# Patient Record
Sex: Female | Born: 1970 | Race: White | Hispanic: No | Marital: Single | Smoking: Former smoker
Health system: Southern US, Community
[De-identification: ages and names within clinical notes are randomized; demographics above are authoritative.]

## PROBLEM LIST (undated history)

## (undated) DIAGNOSIS — D649 Anemia, unspecified: Secondary | ICD-10-CM

---

## 2017-10-22 ENCOUNTER — Encounter (HOSPITAL_COMMUNITY): Payer: Self-pay | Admitting: *Deleted

## 2017-10-22 ENCOUNTER — Inpatient Hospital Stay (HOSPITAL_COMMUNITY): Payer: Self-pay

## 2017-10-22 ENCOUNTER — Inpatient Hospital Stay (HOSPITAL_COMMUNITY)
Admission: AD | Admit: 2017-10-22 | Discharge: 2017-10-24 | DRG: 175 | Disposition: A | Discharge status: Home / Self Care | Payer: Self-pay | Source: Other Acute Inpatient Hospital | Attending: Internal Medicine | Admitting: Internal Medicine

## 2017-10-22 DIAGNOSIS — I2699 Other pulmonary embolism without acute cor pulmonale: Principal | ICD-10-CM | POA: Diagnosis present

## 2017-10-22 DIAGNOSIS — E1165 Type 2 diabetes mellitus with hyperglycemia: Secondary | ICD-10-CM | POA: Diagnosis present

## 2017-10-22 DIAGNOSIS — D509 Iron deficiency anemia, unspecified: Secondary | ICD-10-CM | POA: Diagnosis present

## 2017-10-22 DIAGNOSIS — K838 Other specified diseases of biliary tract: Secondary | ICD-10-CM

## 2017-10-22 DIAGNOSIS — D649 Anemia, unspecified: Secondary | ICD-10-CM | POA: Diagnosis present

## 2017-10-22 DIAGNOSIS — R945 Abnormal results of liver function studies: Secondary | ICD-10-CM

## 2017-10-22 DIAGNOSIS — R8271 Bacteriuria: Secondary | ICD-10-CM | POA: Diagnosis present

## 2017-10-22 DIAGNOSIS — R7989 Other specified abnormal findings of blood chemistry: Secondary | ICD-10-CM

## 2017-10-22 DIAGNOSIS — N921 Excessive and frequent menstruation with irregular cycle: Secondary | ICD-10-CM | POA: Diagnosis present

## 2017-10-22 DIAGNOSIS — Z87891 Personal history of nicotine dependence: Secondary | ICD-10-CM

## 2017-10-22 DIAGNOSIS — J9601 Acute respiratory failure with hypoxia: Secondary | ICD-10-CM | POA: Diagnosis present

## 2017-10-22 DIAGNOSIS — R55 Syncope and collapse: Secondary | ICD-10-CM

## 2017-10-22 DIAGNOSIS — I248 Other forms of acute ischemic heart disease: Secondary | ICD-10-CM | POA: Diagnosis present

## 2017-10-22 DIAGNOSIS — R739 Hyperglycemia, unspecified: Secondary | ICD-10-CM | POA: Diagnosis present

## 2017-10-22 HISTORY — DX: Anemia, unspecified: D64.9

## 2017-10-22 LAB — CBC WITH DIFFERENTIAL/PLATELET
Abs Immature Granulocytes: 0.02 10*3/uL (ref 0.00–0.07)
Basophils Absolute: 0 10*3/uL (ref 0.0–0.1)
Basophils Relative: 0 %
Eosinophils Absolute: 0 10*3/uL (ref 0.0–0.5)
Eosinophils Relative: 0 %
HCT: 29.1 % — ABNORMAL LOW (ref 36.0–46.0)
Hemoglobin: 7.6 g/dL — ABNORMAL LOW (ref 12.0–15.0)
Immature Granulocytes: 0 %
Lymphocytes Relative: 10 %
Lymphs Abs: 1.1 10*3/uL (ref 0.7–4.0)
MCH: 17.6 pg — ABNORMAL LOW (ref 26.0–34.0)
MCHC: 26.1 g/dL — ABNORMAL LOW (ref 30.0–36.0)
MCV: 67.2 fL — ABNORMAL LOW (ref 80.0–100.0)
Monocytes Absolute: 0.4 10*3/uL (ref 0.1–1.0)
Monocytes Relative: 4 %
Neutro Abs: 8.8 10*3/uL — ABNORMAL HIGH (ref 1.7–7.7)
Neutrophils Relative %: 86 %
Platelets: 281 10*3/uL (ref 150–400)
RBC: 4.33 MIL/uL (ref 3.87–5.11)
RDW: 19.3 % — ABNORMAL HIGH (ref 11.5–15.5)
WBC: 10.3 10*3/uL (ref 4.0–10.5)
nRBC: 0 % (ref 0.0–0.2)

## 2017-10-22 LAB — IRON AND TIBC
IRON: 11 ug/dL — AB (ref 28–170)
SATURATION RATIOS: 2 % — AB (ref 10.4–31.8)
TIBC: 546 ug/dL — ABNORMAL HIGH (ref 250–450)
UIBC: 535 ug/dL

## 2017-10-22 LAB — FERRITIN: Ferritin: 3 ng/mL — ABNORMAL LOW (ref 11–307)

## 2017-10-22 LAB — HCG, QUANTITATIVE, PREGNANCY: hCG, Beta Chain, Quant, S: <1 m[IU]/mL (ref <5)

## 2017-10-22 LAB — VITAMIN B12: Vitamin B-12: 244 pg/mL (ref 180–914)

## 2017-10-22 LAB — RETICULOCYTES
Immature Retic Fract: 25.3 % — ABNORMAL HIGH (ref 2.3–15.9)
RBC.: 4.33 MIL/uL (ref 3.87–5.11)
Retic Count, Absolute: 43.3 10*3/uL (ref 19.0–186.0)
Retic Ct Pct: 1 % (ref 0.4–3.1)

## 2017-10-22 LAB — FOLATE: FOLATE: 17.3 ng/mL (ref >5.9)

## 2017-10-22 LAB — TROPONIN I: TROPONIN I: 0.85 ng/mL — AB (ref <0.03)

## 2017-10-22 MED ORDER — ACETAMINOPHEN 325 MG PO TABS: 650.0000 mg | ORAL_TABLET | Freq: Four times a day (QID) | ORAL | Status: DC | PRN

## 2017-10-22 MED ORDER — ACETAMINOPHEN 650 MG RE SUPP: 650.0000 mg | Freq: Four times a day (QID) | RECTAL | Status: DC | PRN

## 2017-10-22 MED ORDER — HYDROCODONE-ACETAMINOPHEN 5-325 MG PO TABS
1.0000 | ORAL_TABLET | Freq: Four times a day (QID) | ORAL | Status: DC | PRN
  Administered 2017-10-23: 1 via ORAL
  Filled 2017-10-22: qty 1

## 2017-10-22 MED ORDER — ONDANSETRON HCL 4 MG PO TABS: 4.0000 mg | ORAL_TABLET | Freq: Four times a day (QID) | ORAL | Status: DC | PRN

## 2017-10-22 MED ORDER — HEPARIN (PORCINE) IN NACL 100-0.45 UNIT/ML-% IJ SOLN
1200.0000 [IU]/h | INTRAMUSCULAR | Status: DC
  Administered 2017-10-23: 1300 [IU]/h via INTRAVENOUS
  Administered 2017-10-23: 1200 [IU]/h via INTRAVENOUS
  Filled 2017-10-22 (×2): qty 250

## 2017-10-22 MED ORDER — INSULIN ASPART 100 UNIT/ML ~~LOC~~ SOLN
0.0000 [IU] | SUBCUTANEOUS | Status: DC
  Administered 2017-10-23: 1 [IU] via SUBCUTANEOUS

## 2017-10-22 MED ORDER — SODIUM CHLORIDE 0.9 % IV SOLN
INTRAVENOUS | Status: DC
  Administered 2017-10-22: 22:00:00 via INTRAVENOUS

## 2017-10-22 MED ORDER — ONDANSETRON HCL 4 MG/2ML IJ SOLN: 4.0000 mg | Freq: Four times a day (QID) | INTRAMUSCULAR | Status: DC | PRN

## 2017-10-22 NOTE — H&P (Signed)
[address] Joyce Williams ZOX:[id number] DOB: 27-Apr-1970 DOA: 10/22/2017     PCP: Patient, No Pcp Per  None Outpatient Specialists:  NONE    Patient arrived to ER on  at   Patient coming from: home Lives   With family    Chief Complaint:  Shortness of breath HPI: Joyce Williams is a 47 y.o. female with medical history significant of Tobacco abuse    Presented with  Sudden onset of Shortness of breath after taking a anp while sitting in an chair when she got up she was very short of breath   but felt palpitations. Boyfriend though she may have passed out for a few seconds. She presented to [facility] ER was found to be severely hypoxic at first required nonrebreather and down to 5 L She had CTA done and was found to have large bilateral PE.  Never hypotensive but has been tachycardic up to 116.  Patient does not take any medications.  Denies any history of bleeding.  No blood per rectum.  Hemoglobin noted to be 8.1  Case was discussed with PCCM critical care Dr. Isaiah Serge who advised transfer to [facility] to stepdown and admit to hospitalist service. NO prior hx of blood clots, never had a mammogram No hx of blood clots She have had leg swelling and off for few years now but lately has been swollen more both legs.  Denies any travel she is fairly active recent injury.  No use of hormonal medications she is only using condoms on and off.  Last menstrual bleeding states couple weeks ago. Mother has hx of ovarian cancer and passed from brain tumor.  No history of miscarriages     The following Work up has been ordered so far:  Orders Placed This Encounter  Procedures  . Heparin level (unfractionated)  . Heparin level (unfractionated)  . CBC  . heparin per pharmacy consult     Medications  heparin ADULT infusion 100 units/mL (25000 units/219mL sodium chloride 0.45%) (1,100 Units/hr Intravenous Handoff 10/22/17 1936)    Significant initial  Findings:  ABG  7.340/36/56  On 6L  Na 135 K 3.8  Cr 0.6 Lactic Acid 6.4 Glucose 248  LFT's AST 48 Total bili WNL  Trop 0.03 BNP 31 Alb 3.9 WBC  6.3  HG 8.1      UA  ordered     CT A -spoke bilaterally pulmonary embolism segmental and subsegmental right hilar lymph node 50 mm airspace consolidation of the right lower lobe evidence of right heart strain RV to LV ratio of 2 Normal dilation of extrahepatic common bile duct measures up to 11 mm cross-section Ultrasound and MRCP to rule out obstructive lesion  ECG:  Personally reviewed by me showing: HR :108 Rhythm:  Sinus tachycardia  no evidence of ischemic changes QTC 421       ED Triage Vitals [10/22/17 1806]  Enc Vitals Group     BP 118/76     Pulse Rate (!) 110     Resp      Temp 98.6 F (37 C)     Temp Source Oral     SpO2 100 %     Weight 166 lb 14.4 oz (75.7 kg)     Height 5\' 3"  (1.6 m)     Head Circumference      Peak Flow      Pain Score 4     Pain Loc      Pain Edu?  Excl. in GC?   WUJW(11)@       Latest  Blood pressure 118/76, pulse (!) 110, temperature 98.6 F (37 C), temperature source Oral, height 5\' 3"  (1.6 m), weight 75.7 kg, SpO2 100 %.      ER Provider Called:  pCCM    Dr.Mannan They Recommend admit to medicine    Hospitalist was called for admission for pulmonary embolism with possible infarction   Review of Systems:    Pertinent positives include: fatigue, shortness of breath at rest.  dyspnea on exertion, Bilateral lower extremity swelling  Constitutional:  No weight loss, night sweats, Fevers, chills,weight loss  HEENT:  No headaches, Difficulty swallowing,Tooth/dental problems,Sore throat,  No sneezing, itching, ear ache, nasal congestion, post nasal drip,  Cardio-vascular:  No chest pain, Orthopnea, PND, anasarca, dizziness, palpitations.no   GI:  No heartburn, indigestion, abdominal pain, nausea, vomiting, diarrhea, change in bowel habits, loss of appetite, melena, blood in  stool, hematemesis Resp:  no  No excess mucus, no productive cough, No non-productive cough, No coughing up of blood.No change in color of mucus.No wheezing. Skin:  no rash or lesions. No jaundice GU:  no dysuria, change in color of urine, no urgency or frequency. No straining to urinate.  No flank pain.  Musculoskeletal:  No joint pain or no joint swelling. No decreased range of motion. No back pain.  Psych:  No change in mood or affect. No depression or anxiety. No memory loss.  Neuro: no localizing neurological complaints, no tingling, no weakness, no double vision, no gait abnormality, no slurred speech, no confusion  All systems reviewed and apart from HOPI all are negative  Past Medical History:  No past medical history on file.    History reviewed. No pertinent surgical history.  Social History:  Ambulatory   Independently     reports that she quit smoking about 89 years ago. Her smoking use included cigarettes. She has a 30.00 pack-year smoking history. She does not have any smokeless tobacco history on file. She reports that she does not drink alcohol or use drugs.     Family History:   Family History  Problem Relation Age of Onset  . Cancer Mother   . Alzheimer's disease Father     Allergies: Allergies not on file   Prior to Admission medications   Not on File   Physical Exam: Blood pressure 118/76, pulse (!) 110, temperature 98.6 F (37 C), temperature source Oral, height 5\' 3"  (1.6 m), weight 75.7 kg, SpO2 100 %. 1. General:  in No Acute distress  Chronically ill -appearing 2. Psychological: Alert and  Oriented 3. Head/ENT:     Dry Mucous Membranes                          Head Non traumatic, neck supple                          Normal   Dentition 4. SKIN:  decreased Skin turgor,  Skin clean Dry and intact no rash 5. Heart: Regular rate and rhythm no  Murmur, no Rub or gallop 6. Lungs:  Clear to auscultation bilaterally, no wheezes or crackles   7.  Abdomen: Soft, non-tender, Non distended obese  bowel sounds present 8. Lower extremities: no clubbing, cyanosis, 1+  edema 9. Neurologically Grossly intact, moving all 4 extremities equally   10. MSK: Normal range of motion   LABS:  No results for input(s): WBC, NEUTROABS, HGB, HCT, MCV, PLT in the last 168 hours. Basic Metabolic Panel: No results for input(s): NA, K, CL, CO2, GLUCOSE, BUN, CREATININE, CALCIUM, MG, PHOS in the last 168 hours.    No results for input(s): AST, ALT, ALKPHOS, BILITOT, PROT, ALBUMIN in the last 168 hours. No results for input(s): LIPASE, AMYLASE in the last 168 hours. No results for input(s): AMMONIA in the last 168 hours.    HbA1C: No results for input(s): HGBA1C in the last 72 hours. CBG: No results for input(s): GLUCAP in the last 168 hours.    Urine analysis: No results found for: COLORURINE, APPEARANCEUR, LABSPEC, PHURINE, GLUCOSEU, HGBUR, BILIRUBINUR, KETONESUR, PROTEINUR, UROBILINOGEN, NITRITE, LEUKOCYTESUR     Cultures: No results found for: SDES, SPECREQUEST, CULT, REPTSTATUS   Radiological Exams on Admission: No results found.  Chart has been reviewed    Assessment/Plan   47 y.o. female with medical history significant of Tobacco abuse Admitted for bilateral pulmonary embolism with pulmonary infarct suspected right heart strain, new diagnosis of anemia and hyperglycemia likely secondary to undiagnosed diabetes  Present on Admission: . Pulmonary embolism (HCC) -  Admit to   stepdown  on heparin drip  Would likely benefit from case manager consult for long term anticoagulation  avoid hypotension Cycle cardiac enzymes Order echogram and lower extremity Dopplers  Most likely risk factors for hypercoagulable state being  obesity given new diagnosis of anemia will need malignancy work-up can be done as an outpatient versus inpatient of Hemoccult positive  Would benefit from hypercoagulable workup as an outpatient  patient not a  candidate for tPA    . Anemia - - Most likely cause of Anemia heavy menses -Obtain anemia panel - Hemoccult stool 3 - Check TSH - If evidence of iron deficiency anemia or Hemoccult positive stools will need further GI workup - Obtain type and screen,  Transfuse for hemoglobin below 7 or patient being significantly symptomatic    . Hyperglycemia likely new diagnosis of diabetes mellitus we will order sliding scale diabetic coordinator consult check hemoglobin A1c and TSH . Metrorrhagia -will need further investigation and follow-up if OB/GYN could be contributing to chronic anemia will likely worsen in the setting of anticoagulation may benefit from discussion with OB/GYN prior to discharge . Elevated LFTs -ultrasound to further evaluate no history of alcohol abuse, . Biliary tract distention -obtain ultrasound to further evaluate given slight LFT elevation   Other plan as per orders.  DVT prophylaxis: Heparin drip  Code Status:  FULL CODE  as per patient   I had personally discussed CODE STATUS with patient  Family Communication:   Family   at  Bedside    Disposition Plan:      To home once workup is complete and patient is stable                    Consults called: PCCM was made aware by [facility] ER  Admission status:    inpatient     Expect 2 midnight stay secondary to severity of patient's current illness including hemodynamic instability despite optimal treatment (tachycardia hypoxia) ongoing hypoxia with oxygen requirement   Severe lab radiological abnormalities including low hemoglobin evidence of large bilateral PE   That are currently affecting medical management.  I expect  patient to be hospitalized for 2 midnights requiring inpatient medical care.  Patient is at high risk for adverse outcome (such as loss of life or disability) if not treated.  Indication for inpatient stay as follows:  Hemodynamic instability despite maximal medical therapy,      Need for    IV fluids, , IV anticoagulation      Level of care      SDU tele indefinitely please discontinue once patient no longer qualifies     Joyce Williams 10/22/2017, 11:17 PM    [facility]  Pager [phone]   after 2 AM please page floor coverage PA If 7AM-7PM, please contact the day team taking care of the patient  [email or web address]  Password [email or web address]

## 2017-10-22 NOTE — Progress Notes (Signed)
ANTICOAGULATION CONSULT NOTE - Initial Consult  Pharmacy Consult for Heparin Indication: pulmonary embolus  Allergies not on file  Patient Measurements: Height: 5\' 3"  (160 cm) Weight: 166 lb 14.4 oz (75.7 kg) IBW/kg (Calculated) : 52.4 Heparin Dosing Weight: 68.6 kg  Vital Signs: Temp: 98.6 F (37 C) (10/20 1806) Temp Source: Oral (10/20 1806) BP: 118/76 (10/20 1806) Pulse Rate: 110 (10/20 1806)  Labs: No results for input(s): HGB, HCT, PLT, APTT, LABPROT, INR, HEPARINUNFRC, HEPRLOWMOCWT, CREATININE, CKTOTAL, CKMB, TROPONINI in the last 72 hours.  CrCl cannot be calculated (No successful lab value found.).   Medical History: No past medical history on file.  Medications:  Heparin 5000 unit bolus x 1 given around 1530 @ [address] Heparin @ 1100 units/hr  Assessment: 46 YOF transferred to [facility] from [place] for management of acute PE. Records show B/L acute PE with RV/LV ratio 2. The patient was bolused with Heparin 5000 units x 1 around 1530 and started on a drip rate of 1100 units/hr. Pharmacy consulted to continue dosing this admission.   Labs at [place] show Hgb 8.1, plts 276  Goal of Therapy:  Heparin level 0.3-0.7 units/ml Monitor platelets by anticoagulation protocol: Yes   Plan:  - Continue Heparin at 1100 units/hr (11 ml/hr) - Will obtain a heparin level in 6 hours from estimated start time at [place] and make further adjustments at that time  Thank you for allowing pharmacy to be a part of this patient's care.  Georgina Pillion, PharmD, BCPS Clinical Pharmacist Please check AMION for all [facility] Pharmacy numbers 10/22/2017 7:05 PM

## 2017-10-22 NOTE — Progress Notes (Signed)
Patient arrived via [facility] from [facility], on heparin gtt 1100u/h;  On 3LNC, sats 100%; Dr. Adela Glimpse notified of arrival.

## 2017-10-23 ENCOUNTER — Inpatient Hospital Stay (HOSPITAL_COMMUNITY): Payer: Self-pay

## 2017-10-23 DIAGNOSIS — R0602 Shortness of breath: Secondary | ICD-10-CM

## 2017-10-23 DIAGNOSIS — Z720 Tobacco use: Secondary | ICD-10-CM

## 2017-10-23 DIAGNOSIS — D500 Iron deficiency anemia secondary to blood loss (chronic): Secondary | ICD-10-CM

## 2017-10-23 DIAGNOSIS — I5030 Unspecified diastolic (congestive) heart failure: Secondary | ICD-10-CM

## 2017-10-23 LAB — CBC
HEMATOCRIT: 24 % — AB (ref 36.0–46.0)
Hemoglobin: 6.5 g/dL — CL (ref 12.0–15.0)
MCH: 17.9 pg — ABNORMAL LOW (ref 26.0–34.0)
MCHC: 27.1 g/dL — ABNORMAL LOW (ref 30.0–36.0)
MCV: 66.1 fL — ABNORMAL LOW (ref 80.0–100.0)
NRBC: 0 % (ref 0.0–0.2)
Platelets: 221 10*3/uL (ref 150–400)
RBC: 3.63 MIL/uL — ABNORMAL LOW (ref 3.87–5.11)
RDW: 19.2 % — ABNORMAL HIGH (ref 11.5–15.5)
WBC: 6.4 10*3/uL (ref 4.0–10.5)

## 2017-10-23 LAB — TROPONIN I
TROPONIN I: 0.76 ng/mL — AB (ref <0.03)
TROPONIN I: 0.39 ng/mL — AB (ref <0.03)

## 2017-10-23 LAB — COMPREHENSIVE METABOLIC PANEL
ALT: 29 U/L (ref 0–44)
ANION GAP: 5 (ref 5–15)
AST: 44 U/L — ABNORMAL HIGH (ref 15–41)
Albumin: 2.9 g/dL — ABNORMAL LOW (ref 3.5–5.0)
Alkaline Phosphatase: 98 U/L (ref 38–126)
BUN: 11 mg/dL (ref 6–20)
CHLORIDE: 105 mmol/L (ref 98–111)
CO2: 26 mmol/L (ref 22–32)
Calcium: 8.2 mg/dL — ABNORMAL LOW (ref 8.9–10.3)
Creatinine, Ser: 0.73 mg/dL (ref 0.44–1.00)
GFR calc non Af Amer: >60 mL/min (ref >60)
Glucose, Bld: 120 mg/dL — ABNORMAL HIGH (ref 70–99)
Potassium: 3.8 mmol/L (ref 3.5–5.1)
Sodium: 136 mmol/L (ref 135–145)
Total Bilirubin: 0.3 mg/dL (ref 0.3–1.2)
Total Protein: 5.9 g/dL — ABNORMAL LOW (ref 6.5–8.1)

## 2017-10-23 LAB — HEPARIN LEVEL (UNFRACTIONATED)
HEPARIN UNFRACTIONATED: 0.23 [IU]/mL — AB (ref 0.30–0.70)
Heparin Unfractionated: 0.73 IU/mL — ABNORMAL HIGH (ref 0.30–0.70)
Heparin Unfractionated: 0.6 IU/mL (ref 0.30–0.70)
Heparin Unfractionated: 0.52 IU/mL (ref 0.30–0.70)

## 2017-10-23 LAB — PHOSPHORUS: Phosphorus: 3.9 mg/dL (ref 2.5–4.6)

## 2017-10-23 LAB — ABO/RH: ABO/RH(D): O POS

## 2017-10-23 LAB — HIV ANTIBODY (ROUTINE TESTING W REFLEX): HIV SCREEN 4TH GENERATION: NONREACTIVE

## 2017-10-23 LAB — ECHOCARDIOGRAM COMPLETE
HEIGHTINCHES: 63 in
WEIGHTICAEL: 2670.21 [oz_av]

## 2017-10-23 LAB — GLUCOSE, CAPILLARY
GLUCOSE-CAPILLARY: 104 mg/dL — AB (ref 70–99)
GLUCOSE-CAPILLARY: 121 mg/dL — AB (ref 70–99)
GLUCOSE-CAPILLARY: 123 mg/dL — AB (ref 70–99)
GLUCOSE-CAPILLARY: 130 mg/dL — AB (ref 70–99)
GLUCOSE-CAPILLARY: 134 mg/dL — AB (ref 70–99)

## 2017-10-23 LAB — TSH: TSH: 2.752 u[IU]/mL (ref 0.350–4.500)

## 2017-10-23 LAB — LACTIC ACID, PLASMA: LACTIC ACID, VENOUS: 1 mmol/L (ref 0.5–1.9)

## 2017-10-23 LAB — MRSA PCR SCREENING: MRSA by PCR: POSITIVE — AB

## 2017-10-23 LAB — MAGNESIUM: MAGNESIUM: 2 mg/dL (ref 1.7–2.4)

## 2017-10-23 LAB — BRAIN NATRIURETIC PEPTIDE: B Natriuretic Peptide: 50.2 pg/mL (ref 0.0–100.0)

## 2017-10-23 MED ORDER — LIVING WELL WITH DIABETES BOOK
Freq: Once | Status: AC
  Administered 2017-10-23: 13:00:00
  Filled 2017-10-23: qty 1

## 2017-10-23 MED ORDER — HEPARIN BOLUS VIA INFUSION
1000.0000 [IU] | Freq: Once | INTRAVENOUS | Status: AC
  Administered 2017-10-23: 1000 [IU] via INTRAVENOUS
  Filled 2017-10-23: qty 1000

## 2017-10-23 MED ORDER — CHLORHEXIDINE GLUCONATE CLOTH 2 % EX PADS
6.0000 | MEDICATED_PAD | Freq: Every day | CUTANEOUS | Status: DC
  Administered 2017-10-23: 6 via TOPICAL

## 2017-10-23 MED ORDER — SODIUM CHLORIDE 0.9 % IV SOLN
1500.0000 mg | Freq: Once | INTRAVENOUS | Status: AC
  Administered 2017-10-24: 1500 mg via INTRAVENOUS
  Filled 2017-10-23 (×2): qty 30

## 2017-10-23 MED ORDER — SODIUM CHLORIDE 0.9 % IV SOLN
25.0000 mg | Freq: Once | INTRAVENOUS | Status: AC
  Administered 2017-10-23: 25 mg via INTRAVENOUS
  Filled 2017-10-23: qty 0.5

## 2017-10-23 MED ORDER — SODIUM CHLORIDE 0.9% IV SOLUTION: Freq: Once | INTRAVENOUS | Status: DC

## 2017-10-23 MED ORDER — MUPIROCIN 2 % EX OINT
1.0000 "application " | TOPICAL_OINTMENT | Freq: Two times a day (BID) | CUTANEOUS | Status: DC
  Administered 2017-10-23 – 2017-10-24 (×3): 1 via NASAL
  Filled 2017-10-23: qty 22

## 2017-10-23 NOTE — Progress Notes (Signed)
  Echocardiogram 2D Echocardiogram has been performed.  Pieter Partridge 10/23/2017, 10:47 AM

## 2017-10-23 NOTE — Progress Notes (Signed)
ANTICOAGULATION CONSULT NOTE  Pharmacy Consult for Heparin Indication: pulmonary embolus  No Known Allergies  Patient Measurements: Height: 5\' 3"  (160 cm) Weight: 166 lb 14.2 oz (75.7 kg) IBW/kg (Calculated) : 52.4 Heparin Dosing Weight: 68.6 kg  Vital Signs: Temp: 98.5 F (36.9 C) (10/21 0753) Temp Source: Oral (10/21 0753) BP: 100/67 (10/21 0753) Pulse Rate: 93 (10/21 0753)  Labs: Recent Labs    10/22/17 2112 10/22/17 2331 10/23/17 0444 10/23/17 0716  HGB 7.6*  --   --   --   HCT 29.1*  --   --   --   PLT 281  --   --   --   HEPARINUNFRC  --  0.23*  --  0.73*  CREATININE  --   --  0.73  --   TROPONINI 0.85*  --  0.76*  --     Estimated Creatinine Clearance: 84.7 mL/min (by C-G formula based on SCr of 0.73 mg/dL).   Medical History: Past Medical History:  Diagnosis Date  . Anemia 10/22/2017      Assessment: 46 YOF transferred to [facility] from [place] for management of acute PE. Records show B/L acute PE with RV/LV ratio 2. -heparin = 0.73, hg= 7.3 on 10/21 (Hg noted 8.1 at [place]  Goal of Therapy:  Heparin level 0.3-0.7 units/ml Monitor platelets by anticoagulation protocol: Yes   Plan:  -Decrease heparin 1200 units/hr -Heparin level in 6 hours with CBC and daily wth CBC daily  Harland German, PharmD Clinical Pharmacist Please check Amion for pharmacy contact number

## 2017-10-23 NOTE — Progress Notes (Addendum)
ANTICOAGULATION CONSULT NOTE - Follow Up Consult  Pharmacy Consult for Heparin Indication: pulmonary embolus  No Known Allergies  Patient Measurements: Height: 5\' 3"  (160 cm) Weight: 166 lb 14.2 oz (75.7 kg) IBW/kg (Calculated) : 52.4 Heparin Dosing Weight: 68 kg  Vital Signs: Temp: 99 F (37.2 C) (10/21 1617) Temp Source: Oral (10/21 1617) BP: 104/74 (10/21 1617) Pulse Rate: 97 (10/21 1617)  Labs: Recent Labs    10/22/17 2112 10/22/17 2331 10/23/17 0444 10/23/17 0716 10/23/17 1123 10/23/17 1456  HGB 7.6*  --   --   --   --  6.5*  HCT 29.1*  --   --   --   --  24.0*  PLT 281  --   --   --   --  221  HEPARINUNFRC  --  0.23*  --  0.73*  --  0.60  CREATININE  --   --  0.73  --   --   --   TROPONINI 0.85*  --  0.76*  --  0.39*  --     Estimated Creatinine Clearance: 84.7 mL/min (by C-G formula based on SCr of 0.73 mg/dL).  Assessment:  1 YOF transferred to [facility] from [place] for management of acute PE. Records show B/L acute PE with right heart strain.    Heparin level was just above goal (0.73) this morning on 1300 units/hr and infusion rate decreased to 1200 units/hr.  Level is now therapeutic (0.60) on 1200 units/hr, but Hgb down to 6.5.  Hgb 8.1 at [place] and 7.6 last night at [facility].  Dr. Waymon Amato evaluated; no bleeding overt reported, and planning 2 units PRBCs.  Goal of Therapy:  Heparin level 0.3-0.7 units/ml Monitor platelets by anticoagulation protocol: Yes   Plan:   Continue heparin drip at 1200 units/hr  Repeat heparin level ~10pm tonight, or with post-transfusion CBC.  Daily heparin level and CBC while on heparin.  Monitor for s/sx bleeding.  Dennie Fetters, [place] Pager: [phone] or phone: [phone] 10/23/2017,4:24 PM  Addendum:   Heparin level tonight remains therapeutic (0.52) on 1200 units/hr.    One unit PRBCs given and 2nd infusing now.   Will follow up am heparin level and CBC.  Hilarie Fredrickson, RPh 10/23/2017 11:16 PM

## 2017-10-23 NOTE — Progress Notes (Signed)
ANTICOAGULATION CONSULT NOTE  Pharmacy Consult for Heparin Indication: pulmonary embolus  No Known Allergies  Patient Measurements: Height: 5\' 3"  (160 cm) Weight: 166 lb 14.4 oz (75.7 kg) IBW/kg (Calculated) : 52.4 Heparin Dosing Weight: 68.6 kg  Vital Signs: Temp: 98.6 F (37 C) (10/21 0003) Temp Source: Oral (10/21 0003) BP: 113/71 (10/21 0003) Pulse Rate: 94 (10/21 0003)  Labs: Recent Labs    10/22/17 2112 10/22/17 2331  HGB 7.6*  --   HCT 29.1*  --   PLT 281  --   HEPARINUNFRC  --  0.23*  TROPONINI 0.85*  --     CrCl cannot be calculated (No successful lab value found.).   Medical History: Past Medical History:  Diagnosis Date  . Anemia 10/22/2017    Medications:  Heparin 5000 unit bolus x 1 given around 1530 @ [place] Heparin @ 1100 units/hr  Assessment: 46 YOF transferred to [facility] from [place] for management of acute PE. Records show B/L acute PE with RV/LV ratio 2. The patient was bolused with Heparin 5000 units x 1 around 1530 and started on a drip rate of 1100 units/hr. Pharmacy consulted to continue dosing this admission.  Initial heparin level 0.23 units/ml  Goal of Therapy:  Heparin level 0.3-0.7 units/ml Monitor platelets by anticoagulation protocol: Yes   Plan:  - Increase heparin to 1300 units/hr after 1000 unit bolus - Will obtain a heparin level in 6 hours Monitor daily HL, CBC and s/s bleeding  Thank you for allowing pharmacy to be a part of this patient's care.  Talbert Cage, PharmD Clinical Pharmacist

## 2017-10-23 NOTE — Progress Notes (Signed)
Dr. Waymon Amato notified HGB 6.5.

## 2017-10-23 NOTE — Care Management Note (Addendum)
Case Management Note  Patient Details  Name: Joyce Williams MRN: [id number] Date of Birth: 1970/09/16  Subjective/Objective: Pt presented for Shortness of Breath- positive for Pulmonary Embolus. Plan will be for home on Eliquis. Pt is without Insurance and PCP at this time.                Action/Plan: CM will provide patient with 30 day free card for Eliquis. Patient Assistance Application can be found on shadow chart-MD to fill out. CM did discuss with patient in regards to PCP needs. Patient states that [facility] is to far to drive back to for PCP. Patient states she wants to go to [facility]. CM did call to see if she is accepting new patients without insurance. Flat Rate fee is $80.00 and then next visit is $60.00. CM also call Joyce Williams in [place] and they are not accepting new patients at this time. Appointment will be placed on AVS along with [facility] since they have sliding scale fee, however no appointments- pt can continue to call.  MD Please send Rx's to [name]-medications will be delivered to be bedside before transition home. No further needs from CM at this time.   Expected Discharge Date:                  Expected Discharge Plan:  Home/Self Care  In-House Referral:  NA, PCP / [facility] Connect  Discharge planning Services  CM Consult, Medication Assistance, Indigent Health Clinic  Post Acute Care Choice:  NA Choice offered to:  NA  DME Arranged:  N/A DME Agency:  NA  HH Arranged:  NA HH Agency:  NA  Status of Service:  In process, will continue to follow  If discussed at Long Length of Stay Meetings, dates discussed:    Additional Comments:  Joyce Lewandowsky, RN 10/23/2017, 11:39 AM

## 2017-10-23 NOTE — Progress Notes (Signed)
Bilateral lower extremity venous duplex completed - There is no evidence of a DVT or Baker's cyst. Toma Deiters, RVS 10/23/2017, 10:19 AM

## 2017-10-23 NOTE — Progress Notes (Addendum)
Inpatient Diabetes Program Recommendations  AACE/ADA: New Consensus Statement on Inpatient Glycemic Control (2015)  Target Ranges:  Prepandial:   less than 140 mg/dL      Peak postprandial:   less than 180 mg/dL (1-2 hours)      Critically ill patients:  140 - 180 mg/dL   Lab Results  Component Value Date   GLUCAP 121 (H) 10/23/2017    Review of Glycemic Control Results for MANJOT, BEUMER (MRN [id number]) as of 10/23/2017 10:53  Ref. Range 10/23/2017 00:01 10/23/2017 04:16 10/23/2017 07:52  Glucose-Capillary Latest Ref Range: 70 - 99 mg/dL 409 (H) 811 (H) 914 (H)   Diabetes history: new onset? Outpatient Diabetes medications: none Current orders for Inpatient glycemic control: Novolog 0-9 units Q4H  Inpatient Diabetes Program Recommendations:    Noted consult and MD note for new onset. A1C in process, however, hemoglobin on admission was 7.6%, so unsure of validity of result to determine diagnosis. Will plan to see patient today to discuss.    Addendum@1430 : Spoke with patient regarding new diabetes diagnosis. Patient adamant, "I do not have diabetes." Questioned if provider had yet discussed this with her, she states, "I told you I don't have diabetes."  Reviewed patient's current A1c of 7%. Explained what a A1c is and what it measures. Also reviewed goal A1c with patient, importance of good glucose control @ home, and blood sugar goals. We discussed in detail how this value is impacted by hemoglobin levels and that hers was 7.6. Encouraged a repeat A1C at a follow up visit.   Suggested that patient begin checking blood sugars until her follow up appointment. Ordered LWWDM. Patient is not receptive to meter, checking blood sugars, or speaking with dietitian at this time. Encouraged follow up with a PCP. Patient was hesitant for case management consult to help, however, she agreed. Will place consult for case management, as patient needs follow up.   Thanks, Lujean Rave, MSN, RNC-OB Diabetes Coordinator [phone] (8a-5p)

## 2017-10-23 NOTE — Progress Notes (Signed)
Addendum  Repeat CBC this afternoon shows hemoglobin of 6.5.  No overt bleeding reported.  I discussed in detail with patient and recommended 2 units PRBC transfusion given severity of anemia, need for anticoagulation, may drop further if she has menstrual bleed, symptoms and risks associated with worsening anemia, benefits and risks of blood transfusion.  She was agreeable to blood transfusion.  I discussed with RN.  Marcellus Scott, MD, FACP, [facility]. [facility] Pager [phone]  If 7PM-7AM, please contact night-coverage [email or web address] Password [email or web address] 10/23/2017, 4:09 PM

## 2017-10-23 NOTE — Progress Notes (Signed)
MEDICATION RELATED CONSULT NOTE  Pharmacy Consult for IV iron Indication: IDA  No Known Allergies  Patient Measurements: Height: 5\' 3"  (160 cm) Weight: 166 lb 14.2 oz (75.7 kg) IBW/kg (Calculated) : 52.4   Vital Signs: Temp: 98.6 F (37 C) (10/21 1100) Temp Source: Oral (10/21 1100) BP: 117/75 (10/21 1100) Pulse Rate: 85 (10/21 1100) Intake/Output from previous day: 10/20 0701 - 10/21 0700 In: 505.6 [I.V.:505.6] Out: -  Intake/Output from this shift: No intake/output data recorded.  Labs: Recent Labs    10/22/17 2112 10/23/17 0444  WBC 10.3  --   HGB 7.6*  --   HCT 29.1*  --   PLT 281  --   CREATININE  --  0.73  MG  --  2.0  PHOS  --  3.9  ALBUMIN  --  2.9*  PROT  --  5.9*  AST  --  44*  ALT  --  29  ALKPHOS  --  98  BILITOT  --  0.3   Estimated Creatinine Clearance: 84.7 mL/min (by C-G formula based on SCr of 0.73 mg/dL).   Microbiology: No results found for this or any previous visit (from the past 720 hour(s)).  Medical History: Past Medical History:  Diagnosis Date  . Anemia 10/22/2017     Assessment: 47 yo female with PE on heparin and also noted with  iron deficiency anemia. Iron deficit calculated as ~ 1500mg . Will use IV Dextran -hg= 7.6 (was 8.1 at [place]) , iron= 11, tsat= 2, ferritin= 3   Goal of Therapy:  Hg ~ 14  Plan:  -Iron 25mg  IV test dose followed by 1500mg  IV x1 -Will follow patient progress  Harland German, PharmD Clinical Pharmacist Please check Amion for pharmacy contact number

## 2017-10-23 NOTE — Progress Notes (Signed)
MRSA swab positive.  Dr. Waymon Amato notified.  Patient placed on contact precautions and protocol initiated.  Patient education done.

## 2017-10-23 NOTE — Progress Notes (Signed)
PROGRESS NOTE   Joyce Williams  ZOX:[id number]    DOB: [birth year]    DOA: 10/22/2017  PCP: Patient, No Pcp Per   I have briefly reviewed patients previous medical records in [facility].  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  Initially presented to [facility] ED where she was found to be severely hypoxic and tachycardic requiring nonrebreather mask oxygen.  CTA chest confirmed large bilateral PE with right heart strain.  Her case was discussed with PCCM at [facility] [place] who advised transfer to [facility] stepdown and admission by hospitalist service.  Hemodynamically stable.   Assessment & Plan:   Active Problems:   Pulmonary embolism (HCC)   Pulmonary embolus and infarction (HCC)   Anemia   Hyperglycemia   Metrorrhagia   Elevated LFTs   Biliary tract distention   Acute bilateral PE with right heart strain Etiology unclear.?  Related to sleeping on reclining chair for several hours with associated worsening lower extremity swelling.  However preliminary lower extremity venous Doppler results are negative for DVT (still possible because clots may have migrated up).  No prior personal or family history of VTE or cancer.  No recent long distance travel.  Not on prescription or over-the-counter hormonal supplements.  Follow TTE results.  May need outpatient hematology evaluation to determine cause.  Initiated on IV heparin infusion.  Has been hemodynamically stable.  Hypoxia resolved.  I discussed in detail with patient regarding oral  anticoagulation including options, risks and benefits, side effects etc. including warfarin, Xarelto/Eliquis and Pradaxa.  Patient is leaning towards Eliquis.  She lacks insurance and PCP.  Case management consulted for assistance with PCP, medication needs upon discharge.  She will need to be on oral anticoagulation for at least 6 months.  Continue IV heparin infusion for another 24 hours and then consider transitioning to oral anticoagulation.  Acute respiratory failure with hypoxia Secondary to acute PE.  Resolved.  Elevated troponin Likely related to demand ischemia from acute PE with right heart strain and associated acute respiratory failure with hypoxia.  Follow TTE and consider cardiology consultation if abnormal.  Troponin has a downward trend.  Iron deficiency anemia: Hemoglobin 7.6, MCV 67.2.  Likely due to heavy menstrual bleeding.  Currently not having menstruation.  Asymptomatic.  Anemia panel reviewed: Iron 11, TIBC 546, saturation ratio 2, ferritin 3, folate 17.3 and B12 244.  Recommended outpatient close evaluation with GYN and she verbalized understanding.  Discussed iron supplement options including oral iron versus IV iron, risks and benefits.  She is agreeable to IV iron, ordered per pharmacy.  Follow CBCs closely.  May consider outpatient GI work-up but patient lacks GI symptoms.  Menometrorrhagia Patient reports approximately 1 year history of irregular and heavy menstrual bleeding, may have more than 1 periods during a month, associated with clots.  LMP approximately 3 weeks ago.  Beta hCG negative.  Recommended close outpatient follow-up with GYN and she verbalized understanding.  Hyperglycemia Follow A1c.  Reasonable inpatient control.  Elevated LFTs Only AST minimally elevated, rest of LFTs  unremarkable.  On CT, extrahepatic CBD reportedly measured 11 mm.  Abdominal ultrasound shows gallbladder filled with gallstones and mildly dilated CBD measuring 9 mm.  Stable since  2014 CT.  No features suggestive of acute cholecystitis.  Outpatient follow-up.  Tobacco abuse:  Cessation counseled.   DVT prophylaxis: Currently on IV heparin drip Code Status: Full Family Communication: I discussed in detail with patient's boyfriend and a female friend at the bedside after patient consented.  Updated care and answered questions. Disposition: DC home pending clinical improvement and further work-up.   Consultants:  None  Procedures:  None  Antimicrobials:  None   Subjective: Patient states that she feels fine.  Denies any chest pain, dyspnea, palpitations, dizziness or lightheadedness.  LMP 3 weeks ago.  Reports chronic intermittent bilateral lower extremity swelling ongoing for close to a year but recently worse but no pain reported.  Denies recent long distance travel.  Denies bleeding elsewhere or black-colored stools.  ROS: As above, otherwise negative.  Objective:  Vitals:   10/23/17 0003 10/23/17 0425 10/23/17 0753 10/23/17 1100  BP: 113/71 106/71 100/67 117/75  Pulse: 94 93 93 85  Resp: 18 18 13 13   Temp: 98.6 F (37 C) 98.7 F (37.1 C) 98.5 F (36.9 C) 98.6 F (37 C)  TempSrc: Oral Oral Oral Oral  SpO2: 100% 100% 100% 97%  Weight:  75.7 kg    Height:        Examination:  General exam: Pleasant young female, moderately built and overweight lying comfortably propped up in bed without distress. Respiratory system: Clear to auscultation. Respiratory effort normal. Cardiovascular system: S1 & S2 heard, RRR. No JVD, murmurs, rubs, gallops or clicks.  Trace bilateral leg edema.  Telemetry personally reviewed: Sinus rhythm. Gastrointestinal system: Abdomen is nondistended, soft and nontender. No organomegaly or masses felt. Normal bowel sounds heard. Central nervous system: Alert and oriented. No focal neurological deficits. Extremities: Symmetric 5 x 5 power.  No asymmetric leg swelling or acute findings. Skin: No rashes, lesions or  ulcers Psychiatry: Judgement and insight appear normal. Mood & affect appropriate.     Data Reviewed: I have personally reviewed following labs and imaging studies  CBC: Recent Labs  Lab 10/22/17 2112  WBC 10.3  NEUTROABS 8.8*  HGB 7.6*  HCT 29.1*  MCV 67.2*  PLT 281   Basic Metabolic Panel: Recent Labs  Lab 10/23/17 0444  NA 136  K 3.8  CL 105  CO2 26  GLUCOSE 120*  BUN 11  CREATININE 0.73  CALCIUM 8.2*  MG 2.0  PHOS 3.9   Liver Function Tests: Recent Labs  Lab 10/23/17 0444  AST 44*  ALT 29  ALKPHOS 98  BILITOT 0.3  PROT 5.9*  ALBUMIN 2.9*   Cardiac Enzymes: Recent Labs  Lab 10/22/17 2112 10/23/17 0444  TROPONINI 0.85* 0.76*   HbA1C: No results for input(s): HGBA1C in the last 72 hours. CBG: Recent Labs  Lab 10/23/17 0001 10/23/17 0416 10/23/17 0752 10/23/17 1143  GLUCAP 104* 130* 121* 134*    No results found for this or any previous visit (from the past 240 hour(s)).       Radiology Studies: US Abdomen Complete  Result Date: 10/22/2017 CLINICAL DATA:  Elevated LFTs EXAM: ABDOMEN ULTRASOUND COMPLETE COMPARISON:  CT 06/28/2012 FINDINGS: Gallbladder: Gallbladder filled with gallstones. Gallbladder wall borderline in thickness at 3 mm. Negative sonographic Murphy's. Common bile duct: Diameter: 7 mm proximally, 9 mm distally. This is stable dating back to CT from 2014. Liver: No  focal lesion identified. Within normal limits in parenchymal echogenicity. Portal vein is patent on color Doppler imaging with normal direction of blood flow towards the liver. IVC: No abnormality visualized. Pancreas: Visualized portion unremarkable. Spleen: Size and appearance within normal limits. Right Kidney: Length:. Echogenicity within normal limits. No mass or hydronephrosis visualized. Left Kidney: Length:. Echogenicity within normal limits. No mass or hydronephrosis visualized. Abdominal aorta: No aneurysm visualized. Other findings: None. IMPRESSION:  Gallbladder filled with gallstones. Mildly dilated common bile duct measures 9 mm, stable since 2014 CT. Electronically Signed   By: Charlett Nose M.D.   On: 10/22/2017 22:37        Scheduled Meds: . insulin aspart  0-9 Units Subcutaneous Q4H  . living well with diabetes book   Does not apply Once   Continuous Infusions: . heparin 1,200 Units/hr (10/23/17 0928)     LOS: 1 day     Marcellus Scott, MD, FACP, [facility]. [facility] Pager [phone]  If 7PM-7AM, please contact night-coverage [email or web address] Password [email or web address] 10/23/2017, 12:02 PM

## 2017-10-24 DIAGNOSIS — R55 Syncope and collapse: Secondary | ICD-10-CM

## 2017-10-24 DIAGNOSIS — N921 Excessive and frequent menstruation with irregular cycle: Secondary | ICD-10-CM

## 2017-10-24 LAB — TYPE AND SCREEN
ABO/RH(D): O POS
ANTIBODY SCREEN: NEGATIVE
UNIT DIVISION: 0
Unit division: 0

## 2017-10-24 LAB — URINALYSIS, ROUTINE W REFLEX MICROSCOPIC
BILIRUBIN URINE: NEGATIVE
GLUCOSE, UA: NEGATIVE mg/dL
KETONES UR: NEGATIVE mg/dL
Nitrite: NEGATIVE
PH: 5 (ref 5.0–8.0)
Protein, ur: NEGATIVE mg/dL
SPECIFIC GRAVITY, URINE: 1.015 (ref 1.005–1.030)

## 2017-10-24 LAB — HEMOGLOBIN A1C
HEMOGLOBIN A1C: 5.7 % — AB (ref 4.8–5.6)
MEAN PLASMA GLUCOSE: 117 mg/dL

## 2017-10-24 LAB — CBC
HEMATOCRIT: 29.1 % — AB (ref 36.0–46.0)
Hemoglobin: 8.3 g/dL — ABNORMAL LOW (ref 12.0–15.0)
MCH: 19.9 pg — ABNORMAL LOW (ref 26.0–34.0)
MCHC: 28.5 g/dL — ABNORMAL LOW (ref 30.0–36.0)
MCV: 69.8 fL — ABNORMAL LOW (ref 80.0–100.0)
NRBC: 0 % (ref 0.0–0.2)
Platelets: 208 10*3/uL (ref 150–400)
RBC: 4.17 MIL/uL (ref 3.87–5.11)
RDW: 21.4 % — ABNORMAL HIGH (ref 11.5–15.5)
WBC: 8.4 10*3/uL (ref 4.0–10.5)

## 2017-10-24 LAB — PREPARE RBC (CROSSMATCH)

## 2017-10-24 LAB — BPAM RBC
Blood Product Expiration Date: 201911222359
Blood Product Expiration Date: 201911222359
ISSUE DATE / TIME: 201910212237
ISSUE DATE / TIME: 201910211629
UNIT TYPE AND RH: 5100
Unit Type and Rh: 5100

## 2017-10-24 LAB — HEPARIN LEVEL (UNFRACTIONATED): HEPARIN UNFRACTIONATED: 0.5 [IU]/mL (ref 0.30–0.70)

## 2017-10-24 LAB — HEPATITIS PANEL, ACUTE
HEP A IGM: NEGATIVE
Hep B C IgM: NEGATIVE
Hepatitis B Surface Ag: NEGATIVE

## 2017-10-24 LAB — PREGNANCY, URINE: PREG TEST UR: NEGATIVE

## 2017-10-24 LAB — GLUCOSE, CAPILLARY: GLUCOSE-CAPILLARY: 121 mg/dL — AB (ref 70–99)

## 2017-10-24 MED ORDER — APIXABAN 5 MG PO TABS
10.0000 mg | ORAL_TABLET | Freq: Two times a day (BID) | ORAL | Status: DC
  Administered 2017-10-24: 10 mg via ORAL
  Filled 2017-10-24: qty 2

## 2017-10-24 MED ORDER — FERROUS SULFATE 325 (65 FE) MG PO TABS: 325.0000 mg | ORAL_TABLET | Freq: Two times a day (BID) | ORAL | 0 refills | Status: AC

## 2017-10-24 MED ORDER — ELIQUIS 5 MG VTE STARTER PACK: ORAL_TABLET | ORAL | 0 refills | Status: AC

## 2017-10-24 MED ORDER — APIXABAN 5 MG PO TABS: 5.0000 mg | ORAL_TABLET | Freq: Two times a day (BID) | ORAL | Status: DC

## 2017-10-24 MED ORDER — MUPIROCIN 2 % EX OINT: 1.0000 "application " | TOPICAL_OINTMENT | Freq: Two times a day (BID) | CUTANEOUS | 0 refills | Status: AC

## 2017-10-24 MED FILL — FERROUS SULFATE 325 MG TAB: 325 (65 FE) | 30 days supply | Qty: 60 | Fill #0

## 2017-10-24 MED FILL — MUPIROCIN 2% OINTMENT: 2 | 10 days supply | Qty: 22 | Fill #0

## 2017-10-24 MED FILL — ELIQUIS STARTER PACK 5 MG T: 5 | 30 days supply | Qty: 74 | Fill #0

## 2017-10-24 NOTE — Discharge Instructions (Addendum)
Information on my medicine - ELIQUIS (apixaban)  This medication education was reviewed with me or my healthcare representative as part of my discharge preparation.  The pharmacist that spoke with me during my hospital stay was:  Alford Highland, [facility]  Why was Eliquis prescribed for you? Eliquis was prescribed to treat blood clots that may have been found in the veins of your legs (deep vein thrombosis) or in your lungs (pulmonary embolism) and to reduce the risk of them occurring again.  What do You need to know about Eliquis ? The starting dose is 10 mg (two 5 mg tablets) taken TWICE daily for the FIRST SEVEN (7) DAYS, then on  10/31/17  the dose is reduced to ONE 5 mg tablet taken TWICE daily.  Eliquis may be taken with or without food.   Try to take the dose about the same time in the morning and in the evening. If you have difficulty swallowing the tablet whole please discuss with your pharmacist how to take the medication safely.  Take Eliquis exactly as prescribed and DO NOT stop taking Eliquis without talking to the doctor who prescribed the medication.  Stopping may increase your risk of developing a new blood clot.  Refill your prescription before you run out.  After discharge, you should have regular check-up appointments with your healthcare provider that is prescribing your Eliquis.    What do you do if you miss a dose? If a dose of ELIQUIS is not taken at the scheduled time, take it as soon as possible on the same day and twice-daily administration should be resumed. The dose should not be doubled to make up for a missed dose.  Important Safety Information A possible side effect of Eliquis is bleeding. You should call your healthcare provider right away if you experience any of the following: ? Bleeding from an injury or your nose that does not stop. ? Unusual colored urine (red or dark brown) or unusual colored stools (red or black). ? Unusual bruising for unknown  reasons. ? A serious fall or if you hit your head (even if there is no bleeding).  Some medicines may interact with Eliquis and might increase your risk of bleeding or clotting while on Eliquis. To help avoid this, consult your healthcare provider or pharmacist prior to using any new prescription or non-prescription medications, including herbals, vitamins, non-steroidal anti-inflammatory drugs (NSAIDs) and supplements.  This website has more information on Eliquis (apixaban): http://[email or web address].com/eliquis/home   Additional discharge instructions  Please get your medications reviewed and adjusted by your Primary MD.  Please request your Primary MD to go over all Hospital Tests and Procedure/Radiological results at the follow up, please get all Hospital records sent to your Prim MD by signing hospital release before you go home.  If you had Pneumonia of Lung problems at the Hospital: Please get a 2 view Chest X ray done in 6-8 weeks after hospital discharge or sooner if instructed by your Primary MD.  If you have Congestive Heart Failure: Please call your Cardiologist or Primary MD anytime you have any of the following symptoms:  1) 3 pound weight gain in 24 hours or 5 pounds in 1 week  2) shortness of breath, with or without a dry hacking cough  3) swelling in the hands, feet or stomach  4) if you have to sleep on extra pillows at night in order to breathe  Follow cardiac low salt diet and 1.5 lit/day fluid restriction.  If you have  diabetes Accuchecks 4 times/day, Once in AM empty stomach and then before each meal. Log in all results and show them to your primary doctor at your next visit. If any glucose reading is under 80 or above 300 call your primary MD immediately.  If you have Seizure/Convulsions/Epilepsy: Please do not drive, operate heavy machinery, participate in activities at heights or participate in high speed sports until you have seen by Primary MD or a Neurologist  and advised to do so again.  If you had Gastrointestinal Bleeding: Please ask your Primary MD to check a complete blood count within one week of discharge or at your next visit. Your endoscopic/colonoscopic biopsies that are pending at the time of discharge, will also need to followed by your Primary MD.  Get Medicines reviewed and adjusted. Please take all your medications with you for your next visit with your Primary MD  Please request your Primary MD to go over all hospital tests and procedure/radiological results at the follow up, please ask your Primary MD to get all Hospital records sent to his/her office.  If you experience worsening of your admission symptoms, develop shortness of breath, life threatening emergency, suicidal or homicidal thoughts you must seek medical attention immediately by calling 911 or calling your MD immediately  if symptoms less severe.  You must read complete instructions/literature along with all the possible adverse reactions/side effects for all the Medicines you take and that have been prescribed to you. Take any new Medicines after you have completely understood and accpet all the possible adverse reactions/side effects.   Do not drive or operate heavy machinery when taking Pain medications.   Do not take more than prescribed Pain, Sleep and Anxiety Medications  Special Instructions: If you have smoked or chewed Tobacco  in the last 2 yrs please stop smoking, stop any regular Alcohol  and or any Recreational drug use.  Wear Seat belts while driving.  Please note You were cared for by a hospitalist during your hospital stay. If you have any questions about your discharge medications or the care you received while you were in the hospital after you are discharged, you can call the unit and asked to speak with the hospitalist on call if the hospitalist that took care of you is not available. Once you are discharged, your primary care physician will handle  any further medical issues. Please note that NO REFILLS for any discharge medications will be authorized once you are discharged, as it is imperative that you return to your primary care physician (or establish a relationship with a primary care physician if you do not have one) for your aftercare needs so that they can reassess your need for medications and monitor your lab values.  You can reach the hospitalist office at phone [phone] or fax [phone]   If you do not have a primary care physician, you can call [phone] for a physician referral.   Pulmonary Embolism A pulmonary embolism (PE) is a sudden blockage or decrease of blood flow in one lung or both lungs. Most blockages come from a blood clot that forms in a lower leg, thigh, or arm vein (deep vein thrombosis, DVT) and travels to the lungs. A clot is blood that has thickened into a gel or solid. PE is a dangerous and life-threatening condition that needs to be treated right away. What are the causes? This condition is usually caused by a blood clot that forms in a vein and moves to the lungs. In  rare cases, it may be caused by air, fat, part of a tumor, or other tissue that moves through the veins and into the lungs. What increases the risk? The following factors may make you more likely to develop this condition:  Having DVT or a history of DVT.  Being older than age 84.  Personal or family history of blood clots or blood clotting disease.  Major or lengthy surgery.  Orthopedic surgery, especially hip or knee replacement.  Traumatic injury, such as breaking a hip or leg.  Spinal cord injury.  Stroke.  Taking medicines that contain estrogen. These include birth control pills and hormone replacement therapy.  Long-term (chronic) lung or heart disease.  Cancer and chemotherapy.  Having a central venous catheter.  Pregnancy and the period after delivery.  What are the signs or symptoms? Symptoms of this  condition usually start suddenly and include:  Shortness of breath while active or at rest.  Coughing or coughing up blood or blood-tinged mucus.  Chest pain that is often worse with deep breaths.  Rapid or irregular heartbeat.  Feeling light-headed or dizzy.  Fainting.  Feeling anxious.  Sweating.  Pain and swelling in a leg. This is a symptom of DVT, which can lead to PE.  How is this diagnosed? This condition may be diagnosed based on:  Your medical history.  A physical exam.  Blood tests to check blood oxygen level and how well your blood clots, and a D-dimer blood test, which checks your blood for a substance that is released when a blood clot breaks apart.  CT pulmonary angiogram. This test checks blood flow in and around your lungs.  Ventilation-perfusion scan, also called a lung VQ scan. This test measures air flow and blood flow to the lungs.  Ultrasound of the legs to look for blood clots.  How is this treated? Treatment for this conditions depends on many factors, such as the cause of your PE, your risk for bleeding or developing more clots, and other medical conditions you have. Treatment aims to remove, dissolve, or stop blood clots from forming or growing larger. Treatment may include:  Blood thinning medicines (anticoagulants) to stop clots from forming or growing. These medicines may be given as a pill, as an injection, or through an IV tube (infusion).  Medicines that dissolve clots (thrombolytics).  A procedure in which a flexible tube is used to remove a blood clot (embolectomy) or deliver medicine to destroy it (catheter-directed thrombolysis).  A procedure in which a filter is inserted into a large vein that carries blood to the heart (inferior vena cava). This filter (vena cava filter) catches blood clots before they reach the lungs.  Surgery to remove the clot (surgical embolectomy). This is rare.  You may need a combination of immediate,  long-term (up to 3 months after diagnosis), and extended (more than 3 months after diagnosis) treatments. Your treatment may continue for several months (maintenance therapy). You and your health care provider will work together to choose the treatment program that is best for you. Follow these instructions at home: If you are taking an anticoagulant medicine:  Take the medicine every day at the same time each day.  Understand what foods and drugs interact with your medicine.  Understand the side effects of this medicine, including excessive bruising or bleeding. Ask your health care provider or pharmacist about other side effects. General instructions  Take over-the-counter and prescription medicines only as told by your health care provider.  Anticoagulant medicines  may cause side effects, including easy bruising and difficulty stopping bleeding. If you are prescribed an anticoagulant: ? Hold pressure over cuts for longer than usual. ? Tell your dentist and other health care providers that you are taking anticoagulants before you have any procedure that may cause bleeding. ? Avoid contact sports. ? Be extra careful when handling sharp objects. ? Use a soft toothbrush. Floss with waxed dental floss. ? Shave with an Neurosurgeon.  Wear a medical alert bracelet or carry a medical alert card that says you have had a PE.  Ask your health care provider when you may return to your normal activities.  Talk with your health care provider about any travel plans. It is important to make sure that you are still able to take your medicine while on trips.  Keep all follow-up visits as told by your health care provider. This is important. How is this prevented? Take these actions to lower your risk of developing another PE:  Exercise regularly. Take frequent walks. For at least 30 minutes every day, engage in: ? Activity that involves moving your arms and legs. ? Activity that encourages good  blood flow through your body by increasing your heart rate.  While traveling, drink plenty of water and avoid drinking alcohol. Ask your health care provider if you should wear below-the-knee compression stockings.  Avoid sitting or lying in bed for long periods of time without moving your legs. Exercise your arms and legs every hour during long-distance travel (over 4 hours).  If you are hospitalized or have surgery, ask your health care provider about your risks and what treatments can help prevent blood clots.  Maintain a healthy weight. Ask your health care provider what weight is healthy for you.  If you are a woman who is over age 98, avoid unnecessary use of medicines that contain estrogen, including birth control pills.  Do not use any products that contain nicotine or tobacco, such as cigarettes and e-cigarettes. This is especially important if you take estrogen medicines. If you need help quitting, ask your health care provider.  See your health care provider for regular checkups. This may include blood tests and ultrasound testing on your legs to check for new blood clots.  Contact a health care provider if:  You missed a dose of your blood thinner medicine. Get help right away if:  You have new or increased pain, swelling, warmth, or redness in an arm or leg.  You have numbness or tingling in an arm or leg.  You have shortness of breath while active or at rest.  You have chest pain.  You have a rapid or irregular heartbeat.  You feel light-headed or dizzy.  You cough up blood.  You have blood in your vomit, stool, or urine.  You have a fever.  You have abdomen (abdominal) pain.  You have a severe fall or head injury.  You have a severe headache.  You have vision changes.  You cannot move your arms or legs.  You are confused or have memory loss.  You are bleeding for 10 minutes or more, even with strong pressure on the wound. These symptoms may represent  a serious problem that is an emergency. Do not wait to see if the symptoms will go away. Get medical help right away. Call your local emergency services (911 in the [place] Do not drive yourself to the hospital. Summary  A pulmonary embolism (PE) is a sudden blockage or decrease of blood  flow in one lung or both lungs. PE is a dangerous and life-threatening condition that needs to be treated right away.  Having deep vein thrombosis (DVT) or a history of DVT is the most common risk factor for PE.  Treatments for this condition usually include medicines to thin your blood (anticoagulants) or medicines to break apart blood clots (thrombolytics).  If you are prescribed blood thinners, it is important to take the medicine every single day at the same time each day.  If you have signs of PE or DVT, call your local emergency services (911 in the [place] This information is not intended to replace advice given to you by your health care provider. Make sure you discuss any questions you have with your health care provider. Document Released: 12/18/1999 Document Revised: 01/23/2016 Document Reviewed: 01/23/2016 Elsevier Interactive Patient Education  2018 [employer].    Syncope Syncope is when you temporarily lose consciousness. Syncope may also be called fainting or passing out. It is caused by a sudden decrease in blood flow to the brain. Even though most causes of syncope are not dangerous, syncope can be a sign of a serious medical problem. Signs that you may be about to faint include:  Feeling dizzy or light-headed.  Feeling nauseous.  Seeing all white or all black in your field of vision.  Having cold, clammy skin.  If you fainted, get medical help right away.Call your local emergency services (911 in the [place] Do not drive yourself to the hospital. Follow these instructions at home: Pay attention to any changes in your symptoms. Take these actions to help with your condition:  Have  someone stay with you until you feel stable.  Do not drive, use machinery, or play sports until your health care provider says it is okay.  Keep all follow-up visits as told by your health care provider. This is important.  If you start to feel like you might faint, lie down right away and raise (elevate) your feet above the level of your heart. Breathe deeply and steadily. Wait until all of the symptoms have passed.  Drink enough fluid to keep your urine clear or pale yellow.  If you are taking blood pressure or heart medicine, get up slowly and take several minutes to sit and then stand. This can reduce dizziness.  Take over-the-counter and prescription medicines only as told by your health care provider.  Get help right away if:  You have a severe headache.  You have unusual pain in your chest, abdomen, or back.  You are bleeding from your mouth or rectum, or you have black or tarry stool.  You have a very fast or irregular heartbeat (palpitations).  You have pain with breathing.  You faint once or repeatedly.  You have a seizure.  You are confused.  You have trouble walking.  You have severe weakness.  You have vision problems. These symptoms may represent a serious problem that is an emergency. Do not wait to see if your symptoms will go away. Get medical help right away. Call your local emergency services (911 in the [place] Do not drive yourself to the hospital. This information is not intended to replace advice given to you by your health care provider. Make sure you discuss any questions you have with your health care provider. Document Released: 12/20/2004 Document Revised: 05/28/2015 Document Reviewed: 09/03/2014 Elsevier Interactive Patient Education  [employer].

## 2017-10-24 NOTE — Progress Notes (Signed)
SATURATION QUALIFICATIONS: (This note is used to comply with regulatory documentation for home oxygen)  Patient Saturations on Room Air at Rest = 100%  Patient Saturations on Room Air while Ambulating = 94%  Patient Saturations on Liters of oxygen while Ambulating = N/A  Please briefly explain why patient needs home oxygen: N/A. Pt did not require oxygen while ambulating

## 2017-10-24 NOTE — Progress Notes (Signed)
ANTICOAGULATION CONSULT NOTE - Follow Up Consult  Pharmacy Consult for Heparin Indication: pulmonary embolus  No Known Allergies  Patient Measurements: Height: 5\' 3"  (160 cm) Weight: 163 lb 12.8 oz (74.3 kg) IBW/kg (Calculated) : 52.4 Heparin Dosing Weight: 68 kg  Vital Signs: Temp: 98.4 F (36.9 C) (10/22 0746) Temp Source: Oral (10/22 0746) BP: 124/87 (10/22 0746) Pulse Rate: 74 (10/22 0746)  Labs: Recent Labs    10/22/17 2112  10/23/17 0444  10/23/17 1123 10/23/17 1456 10/23/17 2144 10/24/17 0440  HGB 7.6*  --   --   --   --  6.5*  --  8.3*  HCT 29.1*  --   --   --   --  24.0*  --  29.1*  PLT 281  --   --   --   --  221  --  208  HEPARINUNFRC  --    < >  --    < >  --  0.60 0.52 0.50  CREATININE  --   --  0.73  --   --   --   --   --   TROPONINI 0.85*  --  0.76*  --  0.39*  --   --   --    < > = values in this interval not displayed.    Estimated Creatinine Clearance: 84 mL/min (by C-G formula based on SCr of 0.73 mg/dL).  Assessment:  31 YOF transferred to [facility] from [place] for management of acute PE. Records show B/L acute PE with right heart strain. HL 0.5, Hb 8.5 s/p 2 units PRBCs   Goal of Therapy:  Heparin level 0.3-0.7 units/ml Monitor platelets by anticoagulation protocol: Yes   Plan:   Continue heparin drip at 1200 units/hr  Daily heparin level and CBC while on heparin.  Monitor for s/sx bleeding. -Follow for PO AC plans.  Shirlee Latch, Rph Please check Amion for pharmacy contact number

## 2017-10-24 NOTE — Progress Notes (Signed)
ANTICOAGULATION CONSULT NOTE - Follow Up Consult  Pharmacy Consult for Heparin Indication: pulmonary embolus  No Known Allergies  Patient Measurements: Height: 5\' 3"  (160 cm) Weight: 163 lb 12.8 oz (74.3 kg) IBW/kg (Calculated) : 52.4 Heparin Dosing Weight: 68 kg  Vital Signs: Temp: 98.4 F (36.9 C) (10/22 0746) Temp Source: Oral (10/22 1220) BP: 131/87 (10/22 1220) Pulse Rate: 77 (10/22 1220)  Labs: Recent Labs    10/22/17 2112  10/23/17 0444  10/23/17 1123 10/23/17 1456 10/23/17 2144 10/24/17 0440  HGB 7.6*  --   --   --   --  6.5*  --  8.3*  HCT 29.1*  --   --   --   --  24.0*  --  29.1*  PLT 281  --   --   --   --  221  --  208  HEPARINUNFRC  --    < >  --    < >  --  0.60 0.52 0.50  CREATININE  --   --  0.73  --   --   --   --   --   TROPONINI 0.85*  --  0.76*  --  0.39*  --   --   --    < > = values in this interval not displayed.    Estimated Creatinine Clearance: 84 mL/min (by C-G formula based on SCr of 0.73 mg/dL).  Assessment:  45 YOF transferred to [facility] from [place] for management of acute PE. Records show B/L acute PE with right heart strain. Starting apixaban today.     Goal of Therapy:  Heparin level 0.3-0.7 units/ml Monitor platelets by anticoagulation protocol: Yes   Plan:  -apixiban 10mg  po bid for 7 days followed by 5mg  po bid -Will provide patient education  Harland German, PharmD Clinical Pharmacist Please check Amion for pharmacy contact number
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
## 2020-04-14 IMAGING — US US ABDOMEN COMPLETE
1 series · 14 of 25 positions shown · non-contrast
Comparison: CT 06/28/2012

CLINICAL DATA: Elevated LFTs

EXAM:
ABDOMEN ULTRASOUND COMPLETE

[Series 1: us abdomen complete · 0.20mm/px · 14 of 70 slices shown]
[im 1/70]
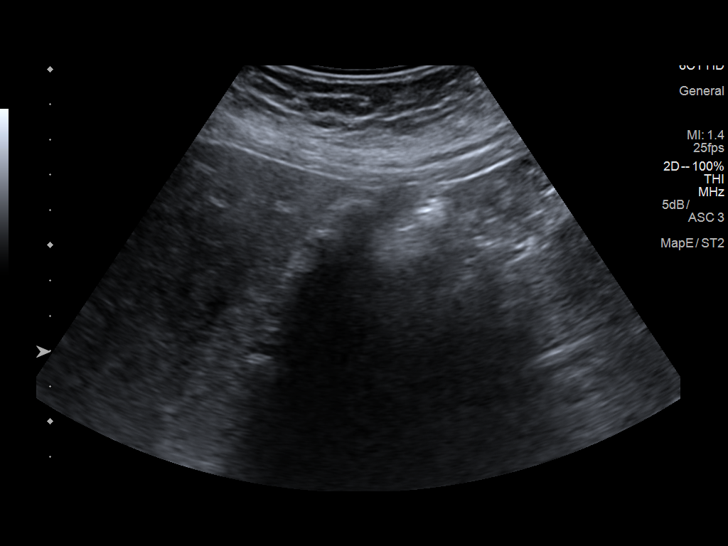
[im 6/70]
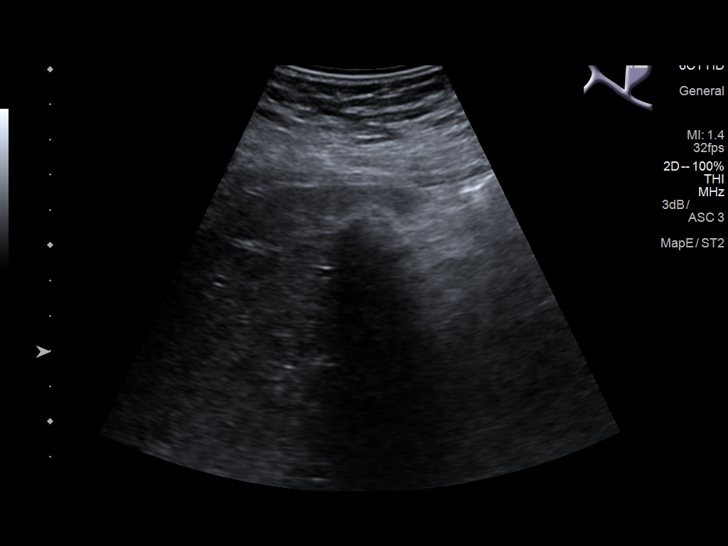
[im 12/70]
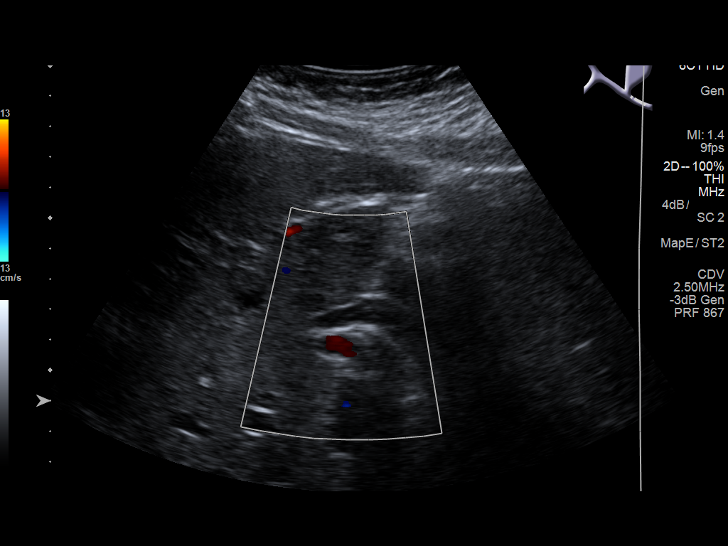
[im 18/70]
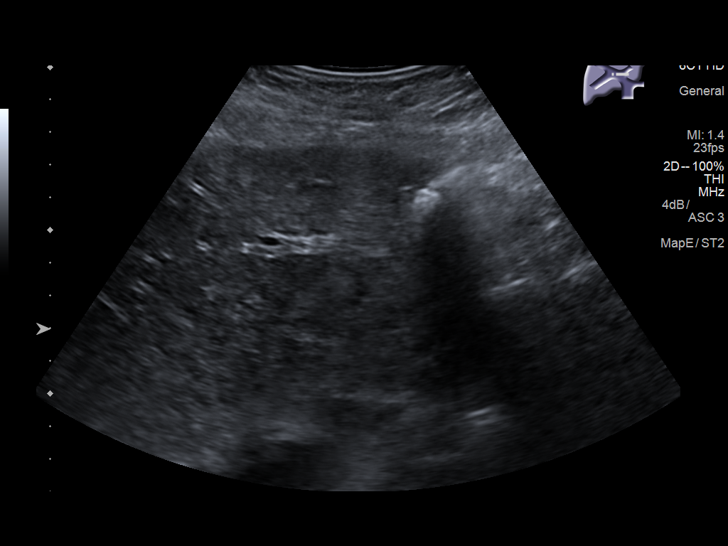
[im 24/70]
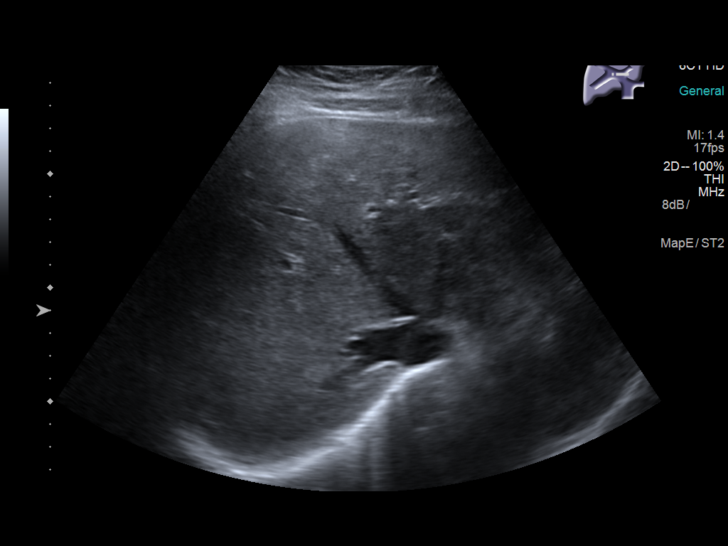
[im 26/70]
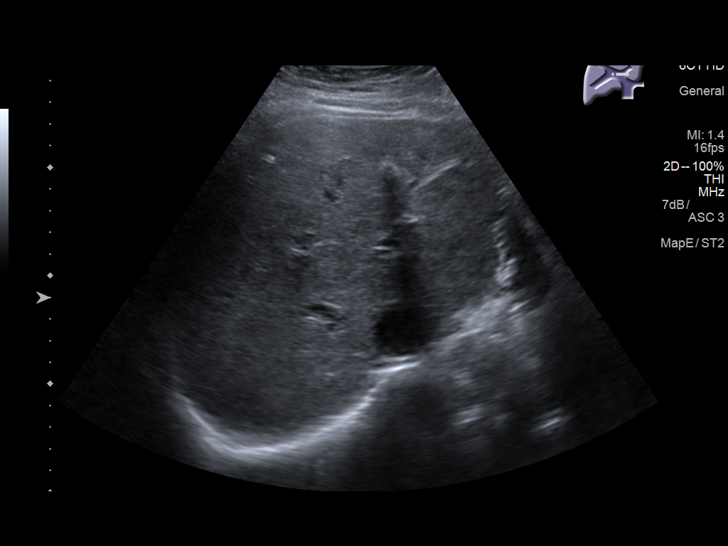
[im 32/70]
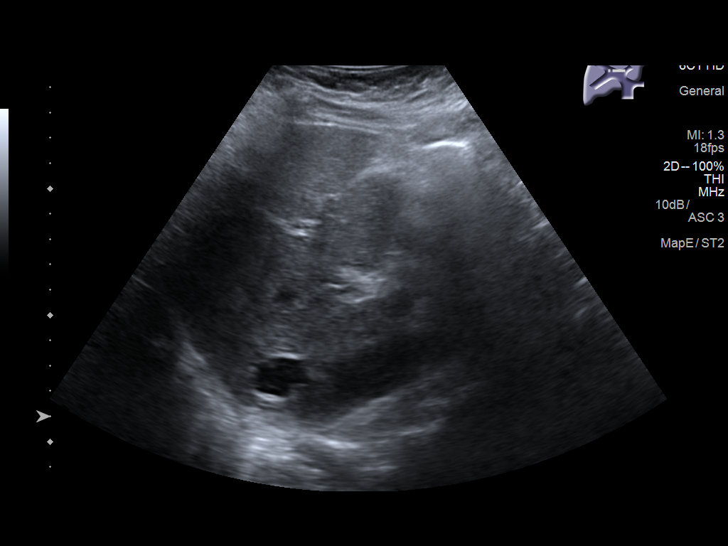
[im 38/70]
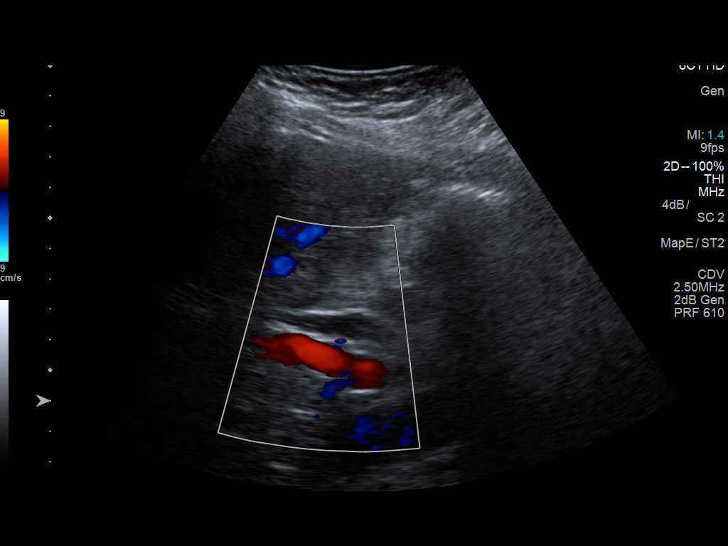
[im 44/70]
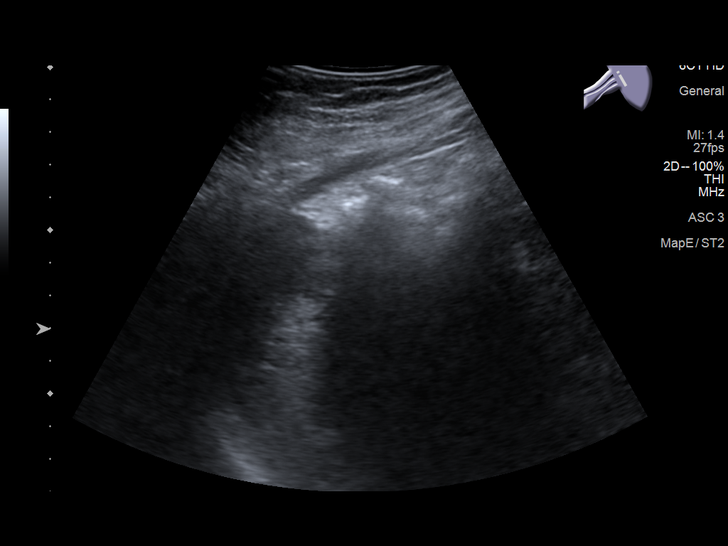
[im 47/70]
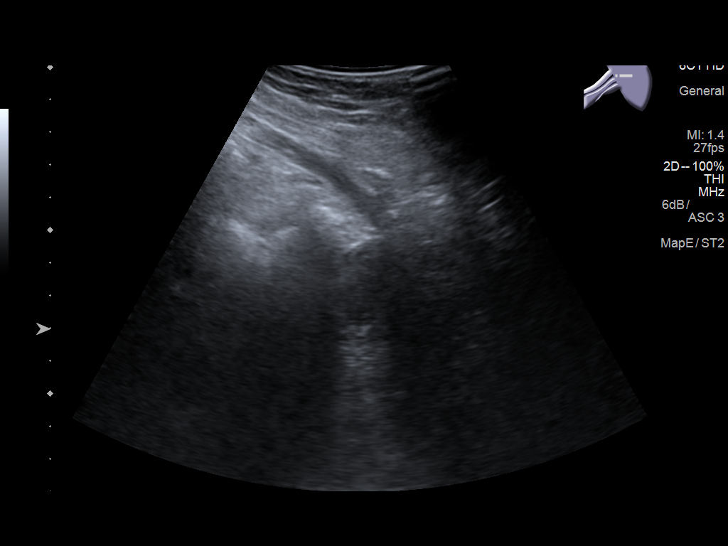
[im 52/70]
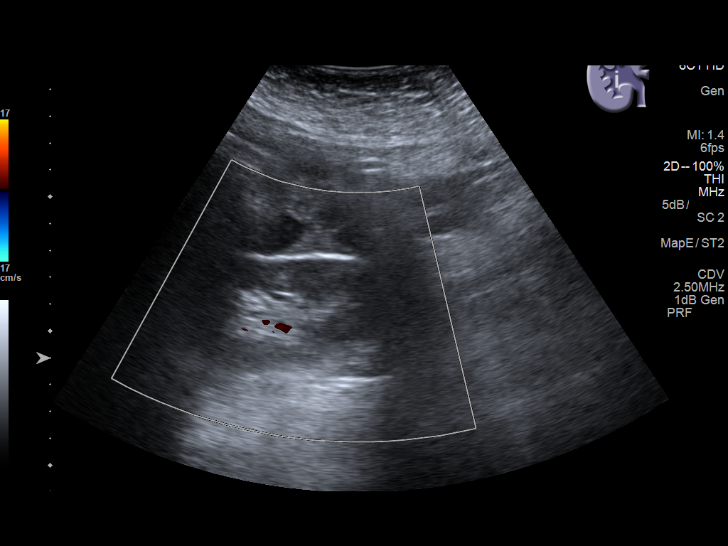
[im 58/70]
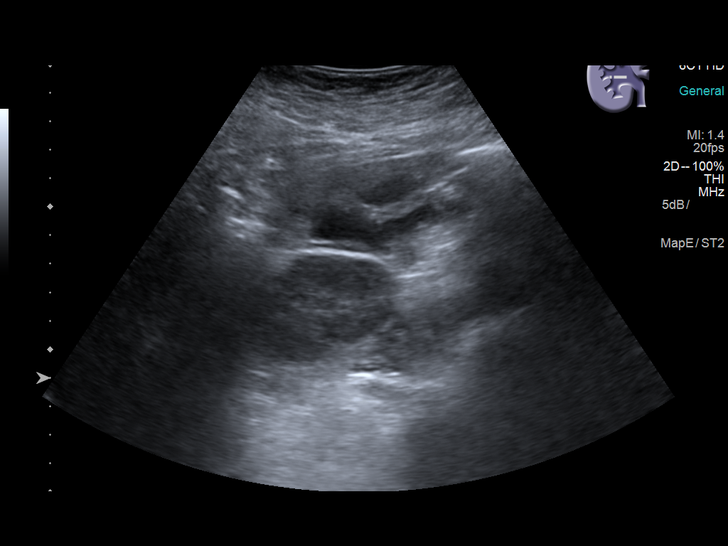
[im 64/70]
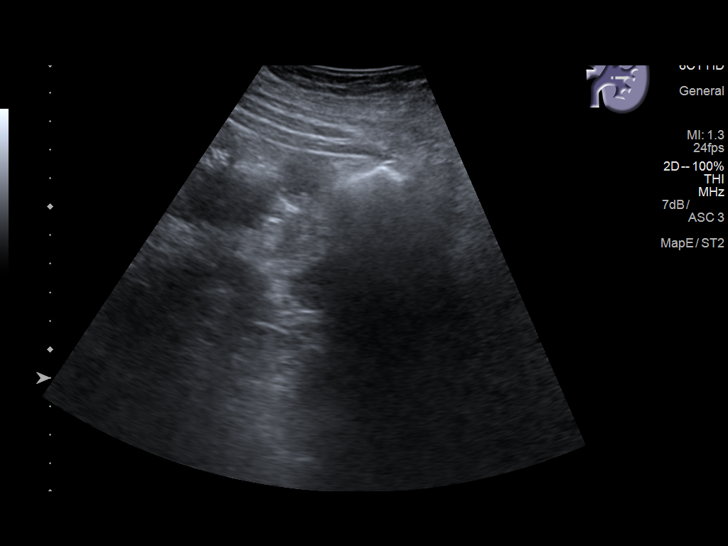
[im 70/70]
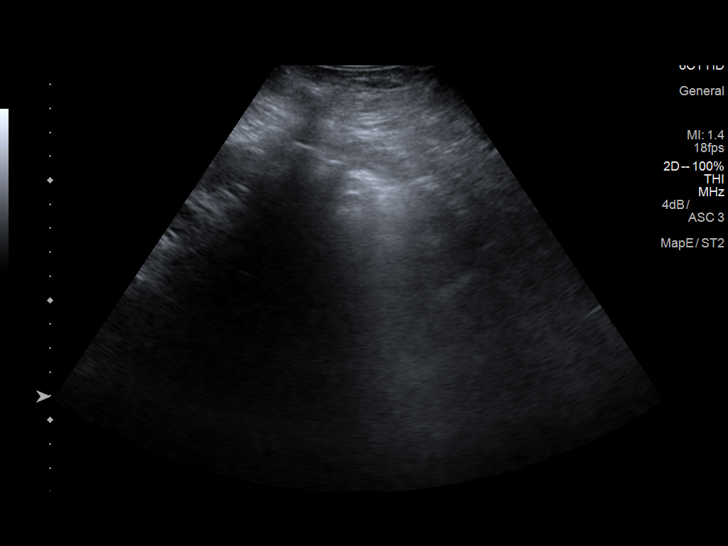

[14 of 25 positions shown; findings below may reference images not displayed]

FINDINGS: Gallbladder: Gallbladder filled with gallstones. Gallbladder wall
borderline in thickness at 3 mm. Negative sonographic Savage.

Common bile duct: Diameter: 7 mm proximally, 9 mm distally. This is
stable dating back to CT from 0128.

Liver: No focal lesion identified. Within normal limits in
parenchymal echogenicity. Portal vein is patent on color Doppler
imaging with normal direction of blood flow towards the liver.

IVC: No abnormality visualized.

Pancreas: Visualized portion unremarkable.

Spleen: Size and appearance within normal limits.

Right Kidney: Length:. Echogenicity within normal limits. No mass or
hydronephrosis visualized.

Left Kidney: Length:. Echogenicity within normal limits. No mass or
hydronephrosis visualized.

Abdominal aorta: No aneurysm visualized.

Other findings: None.
IMPRESSION: Gallbladder filled with gallstones.

Mildly dilated common bile duct measures 9 mm, stable since 0128 CT.
